# Patient Record
Sex: Male | Born: 1975 | Race: White | Hispanic: No | Marital: Single | Smoking: Current every day smoker
Health system: Southern US, Community
[De-identification: ages and names within clinical notes are randomized; demographics above are authoritative.]

---

## 2015-12-13 ENCOUNTER — Encounter: Payer: Self-pay | Admitting: Podiatry

## 2015-12-13 ENCOUNTER — Ambulatory Visit (INDEPENDENT_AMBULATORY_CARE_PROVIDER_SITE_OTHER): Payer: Commercial Managed Care - PPO

## 2015-12-13 ENCOUNTER — Ambulatory Visit (INDEPENDENT_AMBULATORY_CARE_PROVIDER_SITE_OTHER): Payer: Commercial Managed Care - PPO | Admitting: Podiatry

## 2015-12-13 VITALS — BP 131/84 | HR 80 | Resp 16

## 2015-12-13 DIAGNOSIS — B079 Viral wart, unspecified: Secondary | ICD-10-CM

## 2015-12-13 DIAGNOSIS — M779 Enthesopathy, unspecified: Secondary | ICD-10-CM | POA: Diagnosis not present

## 2015-12-13 DIAGNOSIS — B070 Plantar wart: Secondary | ICD-10-CM

## 2015-12-13 NOTE — Addendum Note (Signed)
Addended by: Lottie Rater E on: 12/13/2015 02:12 PM   Modules accepted: Orders

## 2015-12-13 NOTE — Patient Instructions (Signed)

## 2015-12-13 NOTE — Progress Notes (Signed)
   Subjective:    Patient ID: Pricilla Holm, male    DOB: [birth year], 40 y.o.   MRN: [id number]  HPI: Jason presents today with a chief complaint of a painful left forefoot. States it has been bothering him for many months now and he tries trimming on it himself. He doesn't recall any trauma as far as foreign bodies but he says it has finally got to the point where just cannot bear weight on it any longer. He states that he feels throbbing in the forefoot even after he gets off of his foot.    Review of Systems  All other systems reviewed and are negative.      Objective:   Physical Exam: 40 year old white male vital signs stable alert and oriented 3 in no apparent distress. Pulses are strongly palpable bilateral. Neurologic sensorium is intact per Semmes-Weinstein monofilament. Deep tendon reflexes are intact. Muscle strength is 5 over 5 dorsiflexion plantar flexors and inverters everters MUSCULATURES intact. Orthopedic evaluation and restraints all joints distal to the ankle for range of motion without crepitation. Cutaneous evaluation with straight supple well-hydrated cutis with a solitary verrucoid lesion beneath the fourth metatarsal head of the left foot. Skin lines circumvent the lesion there are thrombosed capillaries visible upon debridement.        Assessment & Plan:  Verruca plantaris left forefoot.  Plan: Surgical curettage of a solitary wart sent for pathologic evaluation after local anesthesia was administered sublesionally. He tolerated this procedure well and was given both oral and written home-going instructions for care and soaking of his foot I will follow-up with him in 1 week.

## 2015-12-20 ENCOUNTER — Ambulatory Visit (INDEPENDENT_AMBULATORY_CARE_PROVIDER_SITE_OTHER): Payer: Commercial Managed Care - PPO | Admitting: Podiatry

## 2015-12-20 ENCOUNTER — Encounter: Payer: Self-pay | Admitting: Podiatry

## 2015-12-20 DIAGNOSIS — B079 Viral wart, unspecified: Secondary | ICD-10-CM

## 2015-12-20 NOTE — Patient Instructions (Signed)

## 2015-12-20 NOTE — Progress Notes (Signed)
He presents today for follow-up of excision wart plantar aspect of the left foot. He states this seems to be doing much better. Still sore to walk on that. He continues to soak in antibiotic-soaked and warm water.  Objective: Vital signs are stable he is alert and oriented 3 margins are contracting granulation tissue is developing as well as epithelialization. No signs of infection at this point.  Assessment: Well-healing surgical foot left.  Plan: Continue to soak but an absence also warm water covered in the daytime with a Band-Aid and Aquaphor ointment and leave open at nighttime. Follow-up with me in 2 weeks if not completely healed.

## 2015-12-25 ENCOUNTER — Telehealth: Payer: Self-pay | Admitting: *Deleted

## 2015-12-25 NOTE — Telephone Encounter (Signed)
Dr. Al Corpus reviewed biopsy 12/13/2015 as plantar wart.  Informed pt of diagnosis and asked status of the biopsy site.  Pt states the site is healing fine.

## 2017-12-07 ENCOUNTER — Ambulatory Visit
Admission: RE | Admit: 2017-12-07 | Discharge: 2017-12-07 | Disposition: A | Discharge status: Home / Self Care | Payer: 59 | Source: Ambulatory Visit | Attending: Internal Medicine | Admitting: Internal Medicine

## 2017-12-07 ENCOUNTER — Other Ambulatory Visit: Payer: Self-pay | Admitting: Internal Medicine

## 2017-12-07 DIAGNOSIS — Z720 Tobacco use: Secondary | ICD-10-CM

## 2018-11-01 ENCOUNTER — Other Ambulatory Visit: Payer: Self-pay | Admitting: Internal Medicine

## 2018-11-01 DIAGNOSIS — R911 Solitary pulmonary nodule: Secondary | ICD-10-CM

## 2018-11-04 ENCOUNTER — Ambulatory Visit
Admission: RE | Admit: 2018-11-04 | Discharge: 2018-11-04 | Disposition: A | Discharge status: Home / Self Care | Payer: 59 | Source: Ambulatory Visit | Attending: Internal Medicine | Admitting: Internal Medicine

## 2018-11-04 DIAGNOSIS — R911 Solitary pulmonary nodule: Secondary | ICD-10-CM

## 2018-11-05 ENCOUNTER — Other Ambulatory Visit: Payer: Self-pay | Admitting: Internal Medicine

## 2018-11-05 DIAGNOSIS — S2239XA Fracture of one rib, unspecified side, initial encounter for closed fracture: Secondary | ICD-10-CM

## 2018-11-22 ENCOUNTER — Other Ambulatory Visit: Payer: 59

## 2020-04-11 ENCOUNTER — Other Ambulatory Visit: Payer: Self-pay | Admitting: Internal Medicine

## 2020-04-11 ENCOUNTER — Ambulatory Visit
Admission: RE | Admit: 2020-04-11 | Discharge: 2020-04-11 | Disposition: A | Discharge status: Home / Self Care | Payer: Managed Care, Other (non HMO) | Source: Ambulatory Visit | Attending: Internal Medicine | Admitting: Internal Medicine

## 2020-04-11 DIAGNOSIS — M25512 Pain in left shoulder: Secondary | ICD-10-CM

## 2020-04-11 DIAGNOSIS — M5412 Radiculopathy, cervical region: Secondary | ICD-10-CM

## 2020-04-11 DIAGNOSIS — R918 Other nonspecific abnormal finding of lung field: Secondary | ICD-10-CM

## 2020-04-11 DIAGNOSIS — M25511 Pain in right shoulder: Secondary | ICD-10-CM

## 2020-05-03 ENCOUNTER — Ambulatory Visit
Admission: RE | Admit: 2020-05-03 | Discharge: 2020-05-03 | Disposition: A | Discharge status: Home / Self Care | Payer: 59 | Source: Ambulatory Visit | Attending: Internal Medicine | Admitting: Internal Medicine

## 2020-05-03 DIAGNOSIS — R918 Other nonspecific abnormal finding of lung field: Secondary | ICD-10-CM

## 2020-05-29 IMAGING — DX DG CERVICAL SPINE 2 OR 3 VIEWS
3 series · 3 of 3 positions shown · non-contrast
Comparison: None.

CLINICAL DATA: Tingling down the right arm

EXAM:
CERVICAL SPINE - 2-3 VIEW

[dg cervical spine 2 or 3 views (1 of 3)]
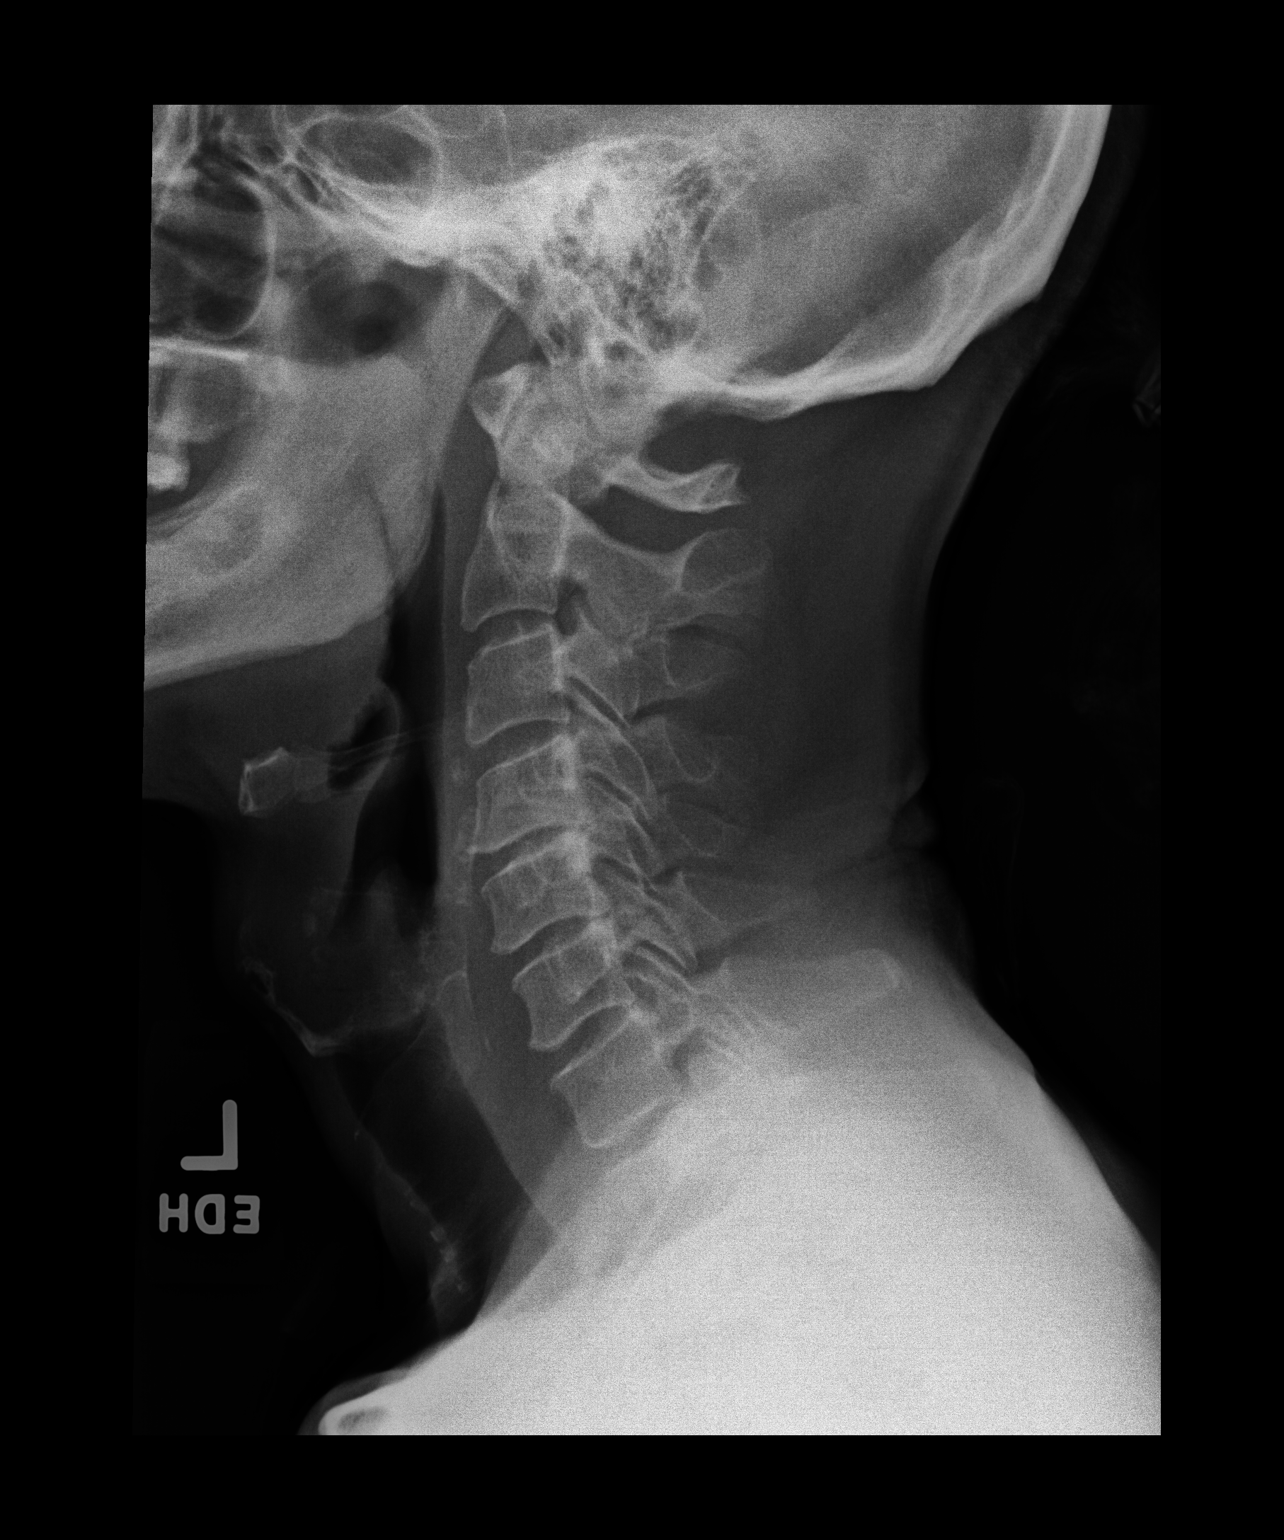

[dg cervical spine 2 or 3 views (2 of 3)]
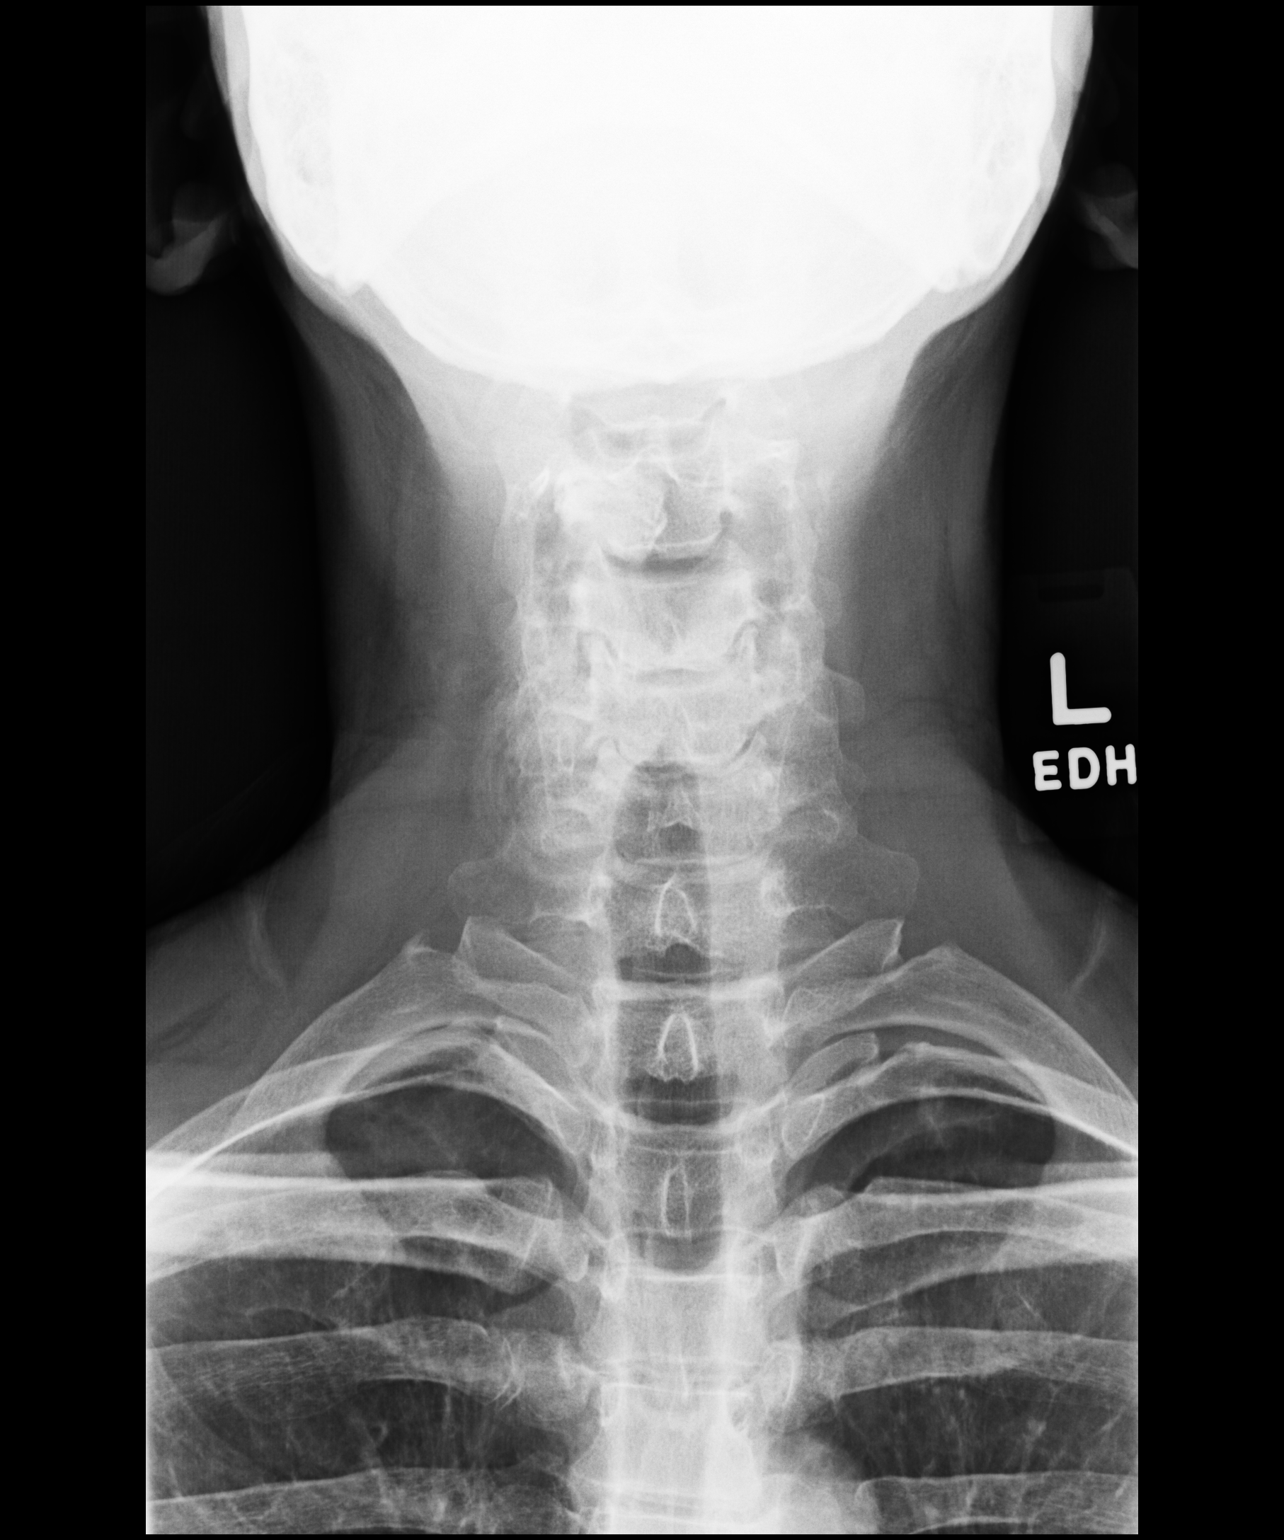

[dg cervical spine 2 or 3 views (3 of 3)]
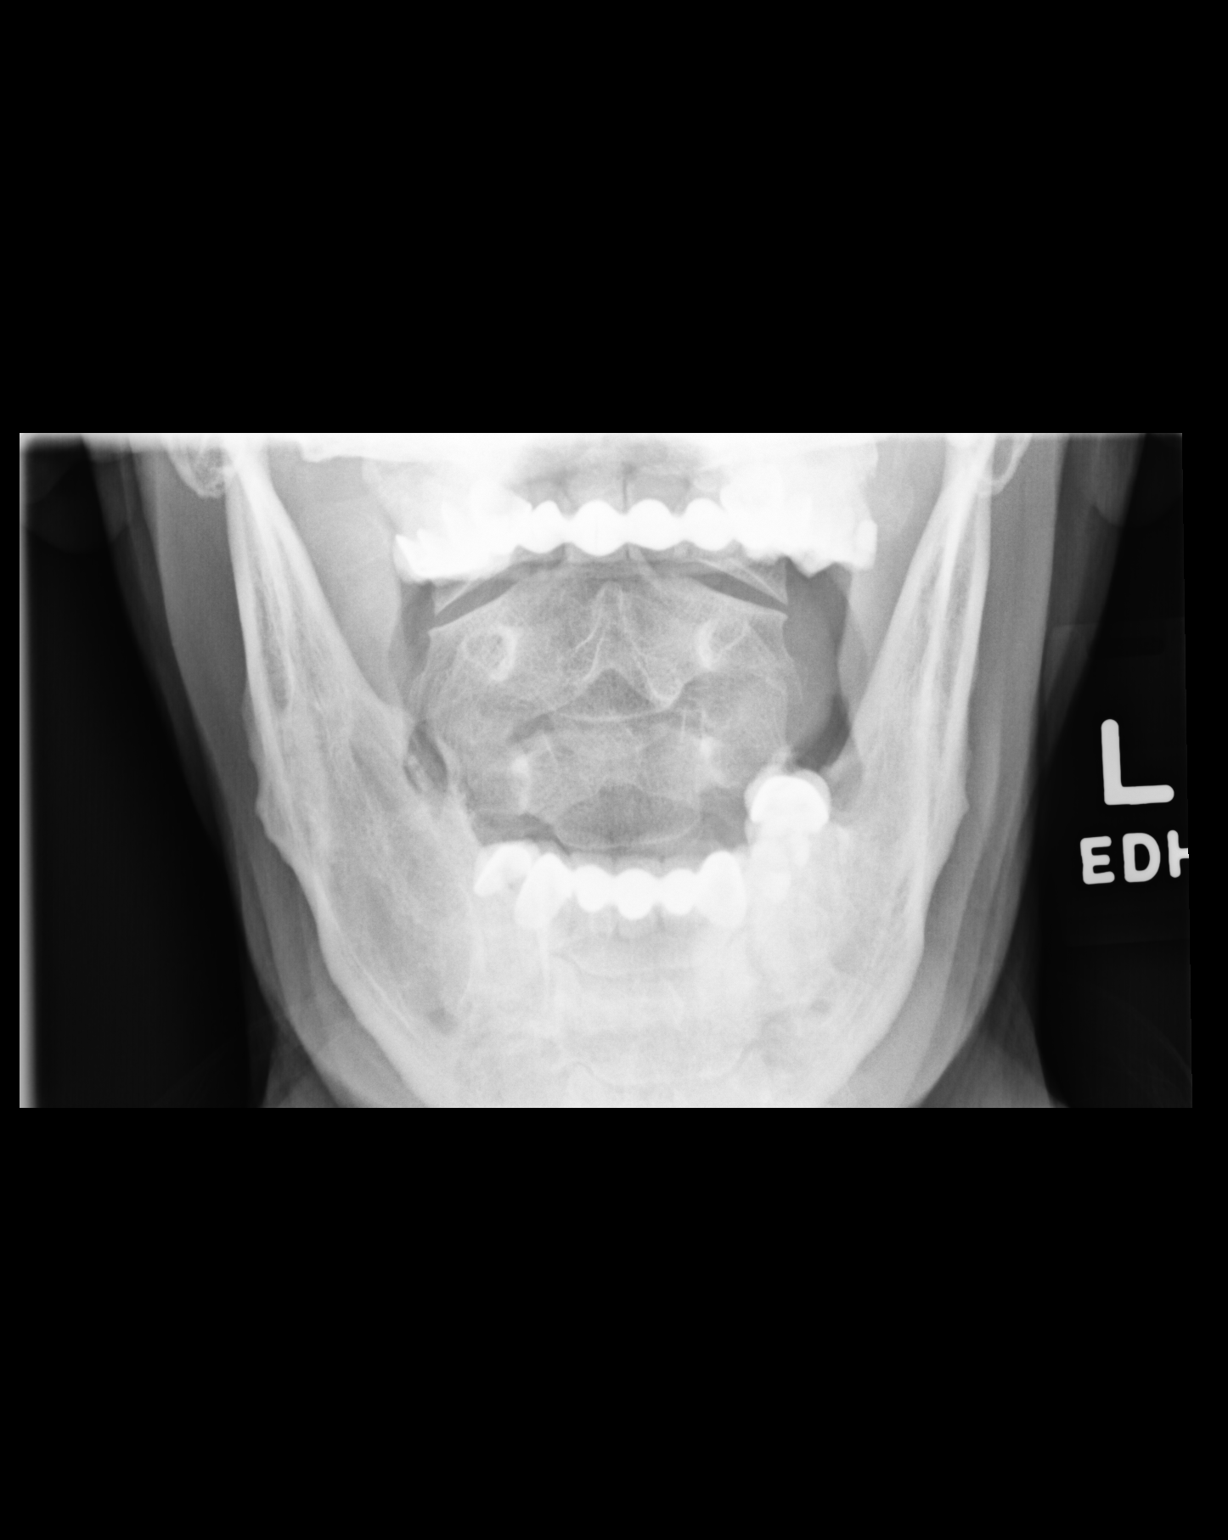

[3 of 3 positions shown; findings below may reference images not displayed]

FINDINGS: Straightening of the cervical spine. Mild degenerative changes C4-C5
and C5-C6. Vertebral body heights are normal. Dens and lateral
masses are within normal limits.
IMPRESSION: Degenerative changes at C4-C5 and C5-C6.

## 2020-12-18 ENCOUNTER — Other Ambulatory Visit: Payer: Self-pay | Admitting: Internal Medicine

## 2020-12-18 DIAGNOSIS — R918 Other nonspecific abnormal finding of lung field: Secondary | ICD-10-CM

## 2020-12-28 ENCOUNTER — Inpatient Hospital Stay: Admission: RE | Admit: 2020-12-28 | Payer: Managed Care, Other (non HMO) | Source: Ambulatory Visit
